# Patient Record
Sex: Male | Born: 1975 | Race: White | Hispanic: No | Marital: Married | Smoking: Never smoker
Health system: Southern US, Community
[De-identification: ages and names within clinical notes are randomized; demographics above are authoritative.]

---

## 2005-11-28 ENCOUNTER — Emergency Department (HOSPITAL_COMMUNITY): Admission: EM | Admit: 2005-11-28 | Discharge: 2005-11-28 | Payer: Self-pay | Admitting: Emergency Medicine

## 2009-06-05 ENCOUNTER — Encounter: Payer: Self-pay | Admitting: Internal Medicine

## 2009-06-05 ENCOUNTER — Ambulatory Visit: Payer: Self-pay | Admitting: Internal Medicine

## 2009-06-05 DIAGNOSIS — M25569 Pain in unspecified knee: Secondary | ICD-10-CM

## 2009-06-05 DIAGNOSIS — K130 Diseases of lips: Secondary | ICD-10-CM

## 2009-06-05 LAB — CONVERTED CEMR LAB
LDL Cholesterol: 95 mg/dL (ref 0–99)
Triglycerides: 86 mg/dL (ref 0.0–149.0)

## 2009-12-28 ENCOUNTER — Ambulatory Visit: Payer: Self-pay | Admitting: Internal Medicine

## 2010-05-28 NOTE — Assessment & Plan Note (Signed)
Summary: cough.cold/cd  jones pt   Vital Signs:  Patient profile:   35 year old male Height:      74 inches Weight:      249 pounds BMI:     32.09 O2 Sat:      98 % on Room air Temp:     98.6 degrees F oral Pulse rate:   98 / minute BP sitting:   120 / 66  (left arm) Cuff size:   regular  Vitals Entered By: Bill Salinas CMA (December 28, 2009 4:00 PM)  O2 Flow:  Room air CC: pt c/o cough x 5 days with yellow mucous production/ab   Primary Care Provider:  Etta Grandchild MD  CC:  pt c/o cough x 5 days with yellow mucous production/ab.  History of Present Illness: Presents for a 5 day h/o sore throat. He has had a cough with scant production of sputum. No documented fever. Myalgias that are better today. No rigors, no SOB.  Current Medications (verified): 1)  None  Allergies (verified): No Known Drug Allergies  Past History:  Past Medical History: Last updated: 06/05/2009 Unremarkable  Past Surgical History: Last updated: 06/05/2009 Denies surgical history  Family History: Last updated: 06/05/2009 Family History of Arthritis Family History High cholesterol Family History Hypertension  Social History: Last updated: 06/05/2009 Occupation: Real Estate Married Alcohol use-yes Drug use-no non-smoker  Review of Systems       The patient complains of prolonged cough.  The patient denies anorexia, fever, weight loss, weight gain, decreased hearing, chest pain, dyspnea on exertion, hemoptysis, abdominal pain, hematochezia, hematuria, genital sores, difficulty walking, depression, and abnormal bleeding.    Physical Exam  General:  WNWD white male in no acute distress Head:  no tenderness to percussion over the frontal and max sinus Eyes:  C&S clear, pupils equal and pupils round.   Ears:  TMs normal Mouth:  Throat with mild erythema, no exudate, tonsils present and appear normal Neck:  supple and full ROM.   Lungs:  normal respiratory effort, no intercostal  retractions, no accessory muscle use, normal breath sounds, no crackles, and no wheezes.   Heart:  normal rate and regular rhythm.   Skin:  turgor normal, color normal, and no suspicious lesions.   Cervical Nodes:  no anterior cervical adenopathy and no posterior cervical adenopathy.     Impression & Recommendations:  Problem # 1:  PHARYNGITIS-ACUTE (ICD-462)  Erythema and pain. cough but lungs are clear and sputum is not purulent.  Plan - Amox 875mg  two times a day x 7 dyas          anti-tussive of choice  His updated medication list for this problem includes:    Amoxicillin 875 Mg Tabs (Amoxicillin) .Marland Kitchen... 1 by mouth two times a day for pharyngitis  Complete Medication List: 1)  Amoxicillin 875 Mg Tabs (Amoxicillin) .Marland Kitchen.. 1 by mouth two times a day for pharyngitis  Patient Instructions: 1)  Pharyngitis - sore throat: plan Amoxicillin 875mg  two times a day for 7 days; cough syrup of choice - my favorite is robitussin DM; hydrate. No evidence of respiratory infection. If not improved over the next 48 hrs will be happy to see you back. (Hopefully note) Prescriptions: AMOXICILLIN 875 MG TABS (AMOXICILLIN) 1 by mouth two times a day for pharyngitis  #14 x 0   Entered and Authorized by:   Jacques Navy MD   Signed by:   Jacques Navy MD on 12/28/2009  Method used:   Electronically to        [facility] (ret[facility])       [address]       [place], [place]  [address]       Ph: [phone]       Fax: [phone]   RxID:   [id number]

## 2010-05-28 NOTE — Letter (Signed)
Summary: Lipid Letter  [facility]  [address] [place], [place] [address]   Phone: [phone]  Fax: [phone]    06/05/2009  Kraven Calk [address] [place], [place]  [address]  Dear Arlys John:  We have carefully reviewed your last lipid profile from 06/05/2009 and the results are noted below with a summary of recommendations for lipid management.    Cholesterol:       172     Goal: <200   HDL "good" Cholesterol:   40.34     Goal: >40   LDL "bad" Cholesterol:   95     Goal: <130   Triglycerides:       86.0     Goal: <150    EXCELLENT RESULTS! knee xray is normal.    TLC Diet (Therapeutic Lifestyle Change): Saturated Fats & Transfatty acids should be kept < 7% of total calories ***Reduce Saturated Fats Polyunstaurated Fat can be up to 10% of total calories Monounsaturated Fat Fat can be up to 20% of total calories Total Fat should be no greater than 25-35% of total calories Carbohydrates should be 50-60% of total calories Protein should be approximately 15% of total calories Fiber should be at least 20-30 grams a day ***Increased fiber may help lower LDL Total Cholesterol should be < 200mg /day Consider adding plant stanol/sterols to diet (example: Benacol spread) ***A higher intake of unsaturated fat may reduce Triglycerides and Increase HDL    Adjunctive Measures (may lower LIPIDS and reduce risk of Heart Attack) include: Aerobic Exercise (20-30 minutes 3-4 times a week) Limit Alcohol Consumption Weight Reduction Aspirin 75-81 mg a day by mouth (if not allergic or contraindicated) Dietary Fiber 20-30 grams a day by mouth     Current Medications:  None If you have any questions, please call. We appreciate being able to work with you.   Sincerely,    [facility] Etta Grandchild MD

## 2010-05-28 NOTE — Assessment & Plan Note (Signed)
Summary: NEW [employer] PT--PKG--#--STC   Vital Signs:  Patient profile:   35 year old male Height:      74 inches Weight:      244 pounds BMI:     31.44 O2 Sat:      98 % on Room air Temp:     97.5 degrees F oral Pulse rate:   98 / minute Pulse rhythm:   regular Resp:     16 per minute BP sitting:   130 / 80  (left arm)  Vitals Entered By: Lucious Groves (June 05, 2009 9:09 AM)  Nutrition Counseling: Patient's BMI is greater than 25 and therefore counseled on weight management options.  O2 Flow:  Room air CC: NP-BCS--Est care/right knee pain after activity./kb Is Patient Diabetic? No Pain Assessment Patient in pain? no        Primary Care Provider:  Etta Grandchild MD  CC:  NP-BCS--Est care/right knee pain after activity./kb.  History of Present Illness: New to me he wants a complete physical and complains of right knee discomfort off/on for 5 years  with no specific trauma or injury.  Preventive Screening-Counseling & Management  Alcohol-Tobacco     Alcohol drinks/day: 2     Alcohol type: beer     >5/day in last 3 mos: no     Alcohol Counseling: not indicated; use of alcohol is not excessive or problematic     Feels need to cut down: no     Feels annoyed by complaints: no     Feels guilty re: drinking: no     Needs 'eye opener' in am: no     Smoking Status: never  Hep-HIV-STD-Contraception     Hepatitis Risk: no risk noted     HIV Risk: no risk noted     STD Risk: no risk noted     TSE monthly: yes     Testicular SE Education/Counseling to perform regular STE  Safety-Violence-Falls     Seat Belt Use: yes     Helmet Use: no     Firearms in the Home: firearms in the home     Firearm Counseling: to practice firearm safety     Smoke Detectors: yes     Violence in the Home: no risk noted     Sexual Abuse: no      Sexual History:  currently monogamous.        Drug Use:  never and no.        Blood Transfusions:  no.    Medications Prior to Update: 1)   None  Current Medications (verified): 1)  None  Allergies (verified): No Known Drug Allergies  Past History:  Past Medical History: Unremarkable  Past Surgical History: Denies surgical history  Family History: Family History of Arthritis Family History High cholesterol Family History Hypertension  Social History: Reviewed history and no changes required. Occupation: Real Estate Married Alcohol use-yes Drug use-no non-smoker Drug Use:  never, no Smoking Status:  never Risk analyst Use:  yes Hepatitis Risk:  no risk noted HIV Risk:  no risk noted STD Risk:  no risk noted Sexual History:  currently monogamous Blood Transfusions:  no  Review of Systems       The patient complains of weight gain.  The patient denies anorexia, fever, weight loss, chest pain, syncope, dyspnea on exertion, peripheral edema, prolonged cough, headaches, hemoptysis, abdominal pain, hematuria, suspicious skin lesions, abnormal bleeding, enlarged lymph nodes, angioedema, and testicular masses.    Physical  Exam  General:  alert, well-developed, well-nourished, well-hydrated, appropriate dress, normal appearance, healthy-appearing, cooperative to examination, good hygiene, and overweight-appearing.   Head:  normocephalic, atraumatic, no abnormalities observed, and no abnormalities palpated.   Eyes:  vision grossly intact, pupils equal, pupils round, and pupils reactive to light.   Mouth:  he has left sided angular cheilitis c/w yeast infection Neck:  No deformities, masses, or tenderness noted. Chest Wall:  No deformities, masses, tenderness or gynecomastia noted. Breasts:  No masses or gynecomastia noted Lungs:  Normal respiratory effort, chest expands symmetrically. Lungs are clear to auscultation, no crackles or wheezes. Heart:  Normal rate and regular rhythm. S1 and S2 normal without gallop, murmur, click, rub or other extra sounds. Abdomen:  Bowel sounds positive,abdomen soft and non-tender  without masses, organomegaly or hernias noted. Genitalia:  uncircumcised, no hydrocele, no varicocele, no scrotal masses, no testicular masses or atrophy, no cutaneous lesions, and no urethral discharge.   Msk:  right knee has minimal crepitance but normal ROM, no joint tenderness, no joint swelling, no joint warmth, no redness over joints, no joint instability, and no muscle atrophy.   Pulses:  R and L carotid,radial,femoral,dorsalis pedis and posterior tibial pulses are full and equal bilaterally Extremities:  No clubbing, cyanosis, edema, or deformity noted with normal full range of motion of all joints.   Neurologic:  No cranial nerve deficits noted. Station and gait are normal. Plantar reflexes are down-going bilaterally. DTRs are symmetrical throughout. Sensory, motor and coordinative functions appear intact. Skin:  turgor normal, color normal, no rashes, no suspicious lesions, no ecchymoses, no petechiae, no purpura, no ulcerations, and no edema.   Cervical Nodes:  no anterior cervical adenopathy and no posterior cervical adenopathy.   Axillary Nodes:  no R axillary adenopathy and no L axillary adenopathy.   Inguinal Nodes:  no R inguinal adenopathy and no L inguinal adenopathy.   Psych:  Cognition and judgment appear intact. Alert and cooperative with normal attention span and concentration. No apparent delusions, illusions, hallucinations Additional Exam:  EKG is normal.   Impression & Recommendations:  Problem # 1:  KNEE PAIN, RIGHT, ACUTE (ICD-719.46) Assessment New  Orders: T-Knee Comp Right 4 Views [phone])  Discussed strengthening exercises, use of ice or heat, and medications.   Problem # 2:  ROUTINE GENERAL MEDICAL EXAM@HEALTH  CARE FACL (ICD-V70.0) Assessment: New  Orders: Venipuncture (28315) TLB-Lipid Panel (80061-LIPID) EKG w/ Interpretation (93000)  Td Booster: Historical (04/28/1992)    Discussed using sunscreen, use of alcohol, drug use, self testicular exam,  routine dental care, routine eye care, routine physical exam, seat belts, multiple vitamins,and recommendations for immunizations.  Discussed exercise and checking cholesterol.  Discussed gun safety and  safe sex  Problem # 3:  ANGULAR CHEILITIS (ICD-528.5) Assessment: New apply lamisil two times a day   Other Orders: Tdap => 32yrs IM (17616) Admin 1st Vaccine (07371)  Patient Instructions: 1)  Apply Lamisil cream to the left mouth crease twice a day for 10 days. 2)  Please schedule a follow-up appointment as needed. 3)  Take 650-1000mg  of Tylenol every 4-6 hours as needed for relief of pain or comfort of fever AVOID taking more than 4000mg   in a 24 hour period (can cause liver damage in higher doses). 4)  Take 400-600mg  of Ibuprofen (Advil, Motrin) with food every 4-6 hours as needed for relief of pain or comfort of fever.  Preventive Care Screening  Last Tetanus Booster:    Date:  04/28/1992    Results:  Historical     Immunizations Administered:  Tetanus Vaccine:    Vaccine Type: Tdap    Site: right deltoid    Mfr: GlaxoSmithKline    Dose: 0.5 ml    Route: IM    Given by: Lucious Groves    Exp. Date: 06/23/2011    Lot #: [id number]    VIS given: 03/16/07 version given June 05, 2009.

## 2010-06-12 ENCOUNTER — Encounter: Payer: Self-pay | Admitting: Endocrinology

## 2010-06-25 NOTE — Letter (Signed)
Summary: [facility]  [facility]   Imported By: Sherian Rein 06/21/2010 12:26:02  _____________________________________________________________________  External Attachment:    Type:   Image     Comment:   External Document

## 2013-09-11 ENCOUNTER — Encounter (HOSPITAL_COMMUNITY): Payer: Self-pay | Admitting: Emergency Medicine

## 2013-09-11 ENCOUNTER — Emergency Department (HOSPITAL_COMMUNITY): Payer: BC Managed Care – PPO

## 2013-09-11 ENCOUNTER — Emergency Department (HOSPITAL_COMMUNITY)
Admission: EM | Admit: 2013-09-11 | Discharge: 2013-09-11 | Disposition: A | Discharge status: Home / Self Care | Payer: BC Managed Care – PPO | Attending: Emergency Medicine | Admitting: Emergency Medicine

## 2013-09-11 DIAGNOSIS — Y9389 Activity, other specified: Secondary | ICD-10-CM | POA: Insufficient documentation

## 2013-09-11 DIAGNOSIS — IMO0002 Reserved for concepts with insufficient information to code with codable children: Secondary | ICD-10-CM | POA: Insufficient documentation

## 2013-09-11 DIAGNOSIS — S82843A Displaced bimalleolar fracture of unspecified lower leg, initial encounter for closed fracture: Secondary | ICD-10-CM | POA: Insufficient documentation

## 2013-09-11 DIAGNOSIS — X58XXXA Exposure to other specified factors, initial encounter: Secondary | ICD-10-CM | POA: Insufficient documentation

## 2013-09-11 DIAGNOSIS — S82892A Other fracture of left lower leg, initial encounter for closed fracture: Secondary | ICD-10-CM

## 2013-09-11 DIAGNOSIS — Y9289 Other specified places as the place of occurrence of the external cause: Secondary | ICD-10-CM | POA: Insufficient documentation

## 2013-09-11 LAB — CBC WITH DIFFERENTIAL/PLATELET
BASOS ABS: 0 10*3/uL (ref 0.0–0.1)
Basophils Relative: 0 % (ref 0–1)
Eosinophils Absolute: 0 10*3/uL (ref 0.0–0.7)
Eosinophils Relative: 0 % (ref 0–5)
HCT: 41.2 % (ref 39.0–52.0)
Hemoglobin: 13.8 g/dL (ref 13.0–17.0)
LYMPHS PCT: 7 % — AB (ref 12–46)
Lymphs Abs: 1.2 10*3/uL (ref 0.7–4.0)
MCH: 28.8 pg (ref 26.0–34.0)
MCHC: 33.5 g/dL (ref 30.0–36.0)
MCV: 86 fL (ref 78.0–100.0)
Monocytes Absolute: 1.3 10*3/uL — ABNORMAL HIGH (ref 0.1–1.0)
Monocytes Relative: 8 % (ref 3–12)
NEUTROS ABS: 14.4 10*3/uL — AB (ref 1.7–7.7)
NEUTROS PCT: 85 % — AB (ref 43–77)
PLATELETS: 280 10*3/uL (ref 150–400)
RBC: 4.79 MIL/uL (ref 4.22–5.81)
RDW: 13.1 % (ref 11.5–15.5)
WBC: 16.9 10*3/uL — AB (ref 4.0–10.5)

## 2013-09-11 LAB — BASIC METABOLIC PANEL
BUN: 18 mg/dL (ref 6–23)
CALCIUM: 8.7 mg/dL (ref 8.4–10.5)
CHLORIDE: 98 meq/L (ref 96–112)
CO2: 21 mEq/L (ref 19–32)
CREATININE: 0.91 mg/dL (ref 0.50–1.35)
GFR calc non Af Amer: >90 mL/min (ref >90)
Glucose, Bld: 82 mg/dL (ref 70–99)
Potassium: 6.4 mEq/L — ABNORMAL HIGH (ref 3.7–5.3)
SODIUM: 136 meq/L — AB (ref 137–147)

## 2013-09-11 MED ORDER — METHOCARBAMOL 500 MG PO TABS: 500.0000 mg | ORAL_TABLET | Freq: Two times a day (BID) | ORAL | Status: AC

## 2013-09-11 MED ORDER — OXYCODONE-ACETAMINOPHEN 5-325 MG PO TABS: 1.0000 | ORAL_TABLET | ORAL | Status: AC | PRN

## 2013-09-11 MED ORDER — ONDANSETRON HCL 4 MG/2ML IJ SOLN
4.0000 mg | Freq: Once | INTRAMUSCULAR | Status: AC
Start: 2013-09-11 — End: 2013-09-11
  Administered 2013-09-11: 4 mg via INTRAVENOUS
  Filled 2013-09-11: qty 2

## 2013-09-11 MED ORDER — SODIUM CHLORIDE 0.9 % IV BOLUS (SEPSIS)
1000.0000 mL | Freq: Once | INTRAVENOUS | Status: AC
  Administered 2013-09-11: 1000 mL via INTRAVENOUS

## 2013-09-11 MED ORDER — MORPHINE SULFATE 4 MG/ML IJ SOLN
4.0000 mg | Freq: Once | INTRAMUSCULAR | Status: AC
  Administered 2013-09-11: 4 mg via INTRAVENOUS
  Filled 2013-09-11: qty 1

## 2013-09-11 NOTE — ED Notes (Signed)
Iv placed blood drawn 

## 2013-09-11 NOTE — Progress Notes (Signed)
Orthopedic Tech Progress Note Patient Details:  Dorian PodBrian Fagerstrom 08/07/1975 [id number]  Ortho Devices Type of Ortho Device: Ace wrap;Crutches;Post (short leg) splint;Stirrup splint Ortho Device/Splint Location: LLE Ortho Device/Splint Interventions: Ordered;Application;Adjustment   Jennye MoccasinAnthony Craig Ladarius Seubert 09/11/2013, 8:18 PM

## 2013-09-11 NOTE — ED Notes (Signed)
Dr brooks at the bedside.  Iv  Started  Labs drawn pain med given.  Ortho tech  Here to place a splint and crutches

## 2013-09-11 NOTE — Discharge Instructions (Signed)
°  Ankle Fracture °A fracture is a break in the bone. A cast or splint is used to protect and keep your injured bone from moving.  °HOME CARE INSTRUCTIONS  °· Use your crutches as directed. °· To lessen the swelling, keep the injured leg elevated while sitting or lying down. °· Apply ice to the injury for 15-20 minutes, 03-04 times per day while awake for 2 days. Put the ice in a plastic bag and place a thin towel between the bag of ice and your cast. °· If you have a plaster or fiberglass cast: °· Do not try to scratch the skin under the cast using sharp or pointed objects. °· Check the skin around the cast every day. You may put lotion on any red or sore areas. °· Keep your cast dry and clean. °· If you have a plaster splint: °· Wear the splint as directed. °· You may loosen the elastic around the splint if your toes become numb, tingle, or turn cold or blue. °· Do not put pressure on any part of your cast or splint; it may break. Rest your cast only on a pillow the first 24 hours until it is fully hardened. °· Your cast or splint can be protected during bathing with a plastic bag. Do not lower the cast or splint into water. °· Take medications as directed by your caregiver. Only take over-the-counter or prescription medicines for pain, discomfort, or fever as directed by your caregiver. °· Do not drive a vehicle until your caregiver specifically tells you it is safe to do so. °· If your caregiver has given you a follow-up appointment, it is very important to keep that appointment. Not keeping the appointment could result in a chronic or permanent injury, pain, and disability. If there is any problem keeping the appointment, you must call back to this facility for assistance. °SEEK IMMEDIATE MEDICAL CARE IF:  °· Your cast gets damaged or breaks. °· You have continued severe pain or more swelling than you did before the cast was put on. °· Your skin or toenails below the injury turn blue or gray, or feel cold or  numb. °· There is a bad smell or new stains and/or purulent (pus like) drainage coming from under the cast. °If you do not have a window in your cast for observing the wound, a discharge or minor bleeding may show up as a stain on the outside of your cast. Report these findings to your caregiver. °MAKE SURE YOU:  °· Understand these instructions. °· Will watch your condition. °· Will get help right away if you are not doing well or get worse. °Document Released: 04/11/2000 Document Revised: 07/07/2011 Document Reviewed: 11/11/2012 °ExitCare® Patient Information ©2014 [employer]. ° ° °
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
## 2013-09-12 NOTE — ED Provider Notes (Signed)
I saw and evaluated the patient, reviewed the resident's note and I agree with the findings and plan.   EKG Interpretation None        Candyce ChurnJohn David Wing Schoch III, MD 09/12/13 1245

## 2013-09-12 NOTE — Consult Note (Signed)
Patient seen and evaluated in ER Closed bimalleolar ankle fracture Skin intact no abrasions or blistering Spoke with Dr Victorino DikeHewitt - will see patient in office to discuss definitive fracture management
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
## 2013-09-13 ENCOUNTER — Other Ambulatory Visit: Payer: Self-pay | Admitting: Orthopedic Surgery

## 2013-09-13 ENCOUNTER — Ambulatory Visit
Admission: RE | Admit: 2013-09-13 | Discharge: 2013-09-13 | Disposition: A | Discharge status: Home / Self Care | Payer: BC Managed Care – PPO | Source: Ambulatory Visit | Attending: Orthopedic Surgery | Admitting: Orthopedic Surgery

## 2013-09-13 DIAGNOSIS — S82892A Other fracture of left lower leg, initial encounter for closed fracture: Secondary | ICD-10-CM
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
## 2017-05-14 ENCOUNTER — Other Ambulatory Visit: Payer: Self-pay | Admitting: Orthopedic Surgery

## 2017-05-14 DIAGNOSIS — M5416 Radiculopathy, lumbar region: Secondary | ICD-10-CM

## 2021-09-09 DIAGNOSIS — L247 Irritant contact dermatitis due to plants, except food: Secondary | ICD-10-CM | POA: Diagnosis not present

## 2023-01-15 DIAGNOSIS — J302 Other seasonal allergic rhinitis: Secondary | ICD-10-CM | POA: Diagnosis not present

## 2023-01-15 DIAGNOSIS — D170 Benign lipomatous neoplasm of skin and subcutaneous tissue of head, face and neck: Secondary | ICD-10-CM | POA: Diagnosis not present

## 2023-01-15 DIAGNOSIS — R030 Elevated blood-pressure reading, without diagnosis of hypertension: Secondary | ICD-10-CM | POA: Diagnosis not present

## 2023-02-10 DIAGNOSIS — Z136 Encounter for screening for cardiovascular disorders: Secondary | ICD-10-CM | POA: Diagnosis not present

## 2023-02-10 DIAGNOSIS — R030 Elevated blood-pressure reading, without diagnosis of hypertension: Secondary | ICD-10-CM | POA: Diagnosis not present

## 2023-02-16 ENCOUNTER — Other Ambulatory Visit (HOSPITAL_BASED_OUTPATIENT_CLINIC_OR_DEPARTMENT_OTHER): Payer: Self-pay

## 2023-02-16 DIAGNOSIS — G4733 Obstructive sleep apnea (adult) (pediatric): Secondary | ICD-10-CM | POA: Diagnosis not present

## 2023-02-16 DIAGNOSIS — M779 Enthesopathy, unspecified: Secondary | ICD-10-CM | POA: Diagnosis not present

## 2023-02-16 DIAGNOSIS — I10 Essential (primary) hypertension: Secondary | ICD-10-CM | POA: Diagnosis not present

## 2023-02-16 MED ORDER — WEGOVY 0.5 MG/0.5ML ~~LOC~~ SOAJ
0.5000 mg | SUBCUTANEOUS | 0 refills | Status: DC
  Filled 2023-02-16: qty 2, 28d supply, fill #0

## 2023-02-25 ENCOUNTER — Other Ambulatory Visit (HOSPITAL_BASED_OUTPATIENT_CLINIC_OR_DEPARTMENT_OTHER): Payer: Self-pay

## 2023-02-26 ENCOUNTER — Other Ambulatory Visit (HOSPITAL_BASED_OUTPATIENT_CLINIC_OR_DEPARTMENT_OTHER): Payer: Self-pay

## 2023-02-26 ENCOUNTER — Other Ambulatory Visit: Payer: Self-pay

## 2023-03-23 DIAGNOSIS — G4733 Obstructive sleep apnea (adult) (pediatric): Secondary | ICD-10-CM | POA: Diagnosis not present

## 2023-03-23 DIAGNOSIS — I10 Essential (primary) hypertension: Secondary | ICD-10-CM | POA: Diagnosis not present

## 2023-03-23 DIAGNOSIS — J302 Other seasonal allergic rhinitis: Secondary | ICD-10-CM | POA: Diagnosis not present

## 2023-04-07 ENCOUNTER — Other Ambulatory Visit (HOSPITAL_BASED_OUTPATIENT_CLINIC_OR_DEPARTMENT_OTHER): Payer: Self-pay

## 2023-04-07 DIAGNOSIS — G4733 Obstructive sleep apnea (adult) (pediatric): Secondary | ICD-10-CM

## 2023-04-23 DIAGNOSIS — J0100 Acute maxillary sinusitis, unspecified: Secondary | ICD-10-CM | POA: Diagnosis not present

## 2023-04-23 DIAGNOSIS — H6503 Acute serous otitis media, bilateral: Secondary | ICD-10-CM | POA: Diagnosis not present

## 2023-04-23 DIAGNOSIS — I10 Essential (primary) hypertension: Secondary | ICD-10-CM | POA: Diagnosis not present

## 2023-04-23 DIAGNOSIS — J209 Acute bronchitis, unspecified: Secondary | ICD-10-CM | POA: Diagnosis not present

## 2023-05-05 DIAGNOSIS — J0100 Acute maxillary sinusitis, unspecified: Secondary | ICD-10-CM | POA: Diagnosis not present

## 2023-05-05 DIAGNOSIS — I10 Essential (primary) hypertension: Secondary | ICD-10-CM | POA: Diagnosis not present

## 2023-05-05 DIAGNOSIS — G4733 Obstructive sleep apnea (adult) (pediatric): Secondary | ICD-10-CM | POA: Diagnosis not present

## 2023-05-15 ENCOUNTER — Ambulatory Visit (HOSPITAL_BASED_OUTPATIENT_CLINIC_OR_DEPARTMENT_OTHER): Payer: BC Managed Care – PPO | Attending: Internal Medicine | Admitting: Internal Medicine

## 2023-05-15 DIAGNOSIS — G4733 Obstructive sleep apnea (adult) (pediatric): Secondary | ICD-10-CM | POA: Diagnosis not present

## 2023-05-23 DIAGNOSIS — G4733 Obstructive sleep apnea (adult) (pediatric): Secondary | ICD-10-CM

## 2023-05-23 NOTE — Procedures (Signed)
    Patient Name: Terry, Faulkner Date: 05/15/2023 Gender: Male D.O.B: May 06, 1975 Age (years): 57 Referring Provider: Jamison Oka MD Height (inches): 74 Interpreting Physician: Jetty Duhamel MD, ABSM Weight (lbs): 250 RPSGT: [place] BMI: 32 MRN: [id number] Neck Size: 17.00  CLINICAL INFORMATION Sleep Study Type: HST Indication for sleep study: OSA Epworth Sleepiness Score: 11  SLEEP STUDY TECHNIQUE A multi-channel overnight portable sleep study was performed. The channels recorded were: nasal airflow, thoracic respiratory movement, and oxygen saturation with a pulse oximetry. Snoring was also monitored.  MEDICATIONS Patient self administered medications include: none reported.  SLEEP ARCHITECTURE Patient was studied for 383 minutes. The sleep efficiency was 100.0 % and the patient was supine for 0%. The arousal index was 0.0 per hour.  RESPIRATORY PARAMETERS The overall AHI was 76.0 per hour, with a central apnea index of 0 per hour. The oxygen nadir was 78% during sleep.  CARDIAC DATA Mean heart rate during sleep was 53.9 bpm.  IMPRESSIONS - Severe obstructive sleep apnea occurred during this study (AHI = 76.0/h). - Oxygen desaturation was noted during this study (Min O2 = 78%, Mean 92%). - Patient snored.  DIAGNOSIS - Obstructive Sleep Apnea (G47.33)  RECOMMENDATIONS - Suggest CPAP titration sleep study or autopap. Other options would be based on clinical judgment. - Be careful with alcohol, sedatives and other CNS depressants that may worsen sleep apnea and disrupt normal sleep architecture. - Sleep hygiene should be reviewed to assess factors that may improve sleep quality. - Weight management and regular exercise should be initiated or continued.  [Electronically signed] 05/23/2023 12:16 PM  Jetty Duhamel MD, ABSM Diplomate, American Board of Sleep Medicine NPI: [id number]                         Jetty Duhamel Diplomate, American Board of Sleep Medicine  ELECTRONICALLY SIGNED ON:  05/23/2023, 12:14 PM [facility] PH: [phone]   FX: [phone] ACCREDITED BY THE AMERICAN ACADEMY OF SLEEP MEDICINE

## 2023-07-01 DIAGNOSIS — M109 Gout, unspecified: Secondary | ICD-10-CM | POA: Diagnosis not present

## 2023-07-01 DIAGNOSIS — G4733 Obstructive sleep apnea (adult) (pediatric): Secondary | ICD-10-CM | POA: Diagnosis not present

## 2023-07-01 DIAGNOSIS — I10 Essential (primary) hypertension: Secondary | ICD-10-CM | POA: Diagnosis not present

## 2023-07-23 DIAGNOSIS — G4733 Obstructive sleep apnea (adult) (pediatric): Secondary | ICD-10-CM | POA: Diagnosis not present

## 2023-08-04 DIAGNOSIS — E7800 Pure hypercholesterolemia, unspecified: Secondary | ICD-10-CM | POA: Diagnosis not present

## 2023-08-04 DIAGNOSIS — M109 Gout, unspecified: Secondary | ICD-10-CM | POA: Diagnosis not present

## 2023-08-12 DIAGNOSIS — I10 Essential (primary) hypertension: Secondary | ICD-10-CM | POA: Diagnosis not present

## 2023-08-12 DIAGNOSIS — E7800 Pure hypercholesterolemia, unspecified: Secondary | ICD-10-CM | POA: Diagnosis not present

## 2023-08-12 DIAGNOSIS — G4733 Obstructive sleep apnea (adult) (pediatric): Secondary | ICD-10-CM | POA: Diagnosis not present

## 2023-08-23 DIAGNOSIS — G4733 Obstructive sleep apnea (adult) (pediatric): Secondary | ICD-10-CM | POA: Diagnosis not present

## 2023-09-22 DIAGNOSIS — G4733 Obstructive sleep apnea (adult) (pediatric): Secondary | ICD-10-CM | POA: Diagnosis not present

## 2023-10-13 DIAGNOSIS — G4733 Obstructive sleep apnea (adult) (pediatric): Secondary | ICD-10-CM | POA: Diagnosis not present

## 2023-10-13 DIAGNOSIS — E7800 Pure hypercholesterolemia, unspecified: Secondary | ICD-10-CM | POA: Diagnosis not present

## 2023-10-13 DIAGNOSIS — I10 Essential (primary) hypertension: Secondary | ICD-10-CM | POA: Diagnosis not present

## 2023-10-23 DIAGNOSIS — G4733 Obstructive sleep apnea (adult) (pediatric): Secondary | ICD-10-CM | POA: Diagnosis not present

## 2023-11-22 DIAGNOSIS — G4733 Obstructive sleep apnea (adult) (pediatric): Secondary | ICD-10-CM | POA: Diagnosis not present

## 2023-11-25 DIAGNOSIS — I10 Essential (primary) hypertension: Secondary | ICD-10-CM | POA: Diagnosis not present

## 2023-11-25 DIAGNOSIS — G4733 Obstructive sleep apnea (adult) (pediatric): Secondary | ICD-10-CM | POA: Diagnosis not present

## 2023-12-15 DIAGNOSIS — I10 Essential (primary) hypertension: Secondary | ICD-10-CM | POA: Diagnosis not present

## 2023-12-23 DIAGNOSIS — Z6841 Body Mass Index (BMI) 40.0 and over, adult: Secondary | ICD-10-CM | POA: Diagnosis not present

## 2023-12-23 DIAGNOSIS — G4733 Obstructive sleep apnea (adult) (pediatric): Secondary | ICD-10-CM | POA: Diagnosis not present

## 2023-12-23 DIAGNOSIS — I10 Essential (primary) hypertension: Secondary | ICD-10-CM | POA: Diagnosis not present

## 2024-02-04 DIAGNOSIS — I10 Essential (primary) hypertension: Secondary | ICD-10-CM | POA: Diagnosis not present

## 2024-02-04 DIAGNOSIS — J302 Other seasonal allergic rhinitis: Secondary | ICD-10-CM | POA: Diagnosis not present

## 2024-02-22 DIAGNOSIS — G4733 Obstructive sleep apnea (adult) (pediatric): Secondary | ICD-10-CM | POA: Diagnosis not present
